# Patient Record
Sex: Male | Born: 1940 | Race: White | Hispanic: No | Marital: Married | Smoking: Former smoker
Health system: Southern US, Community
[De-identification: ages and names within clinical notes are randomized; demographics above are authoritative.]

## PROBLEM LIST (undated history)

## (undated) DIAGNOSIS — N400 Enlarged prostate without lower urinary tract symptoms: Secondary | ICD-10-CM

## (undated) DIAGNOSIS — Z87442 Personal history of urinary calculi: Secondary | ICD-10-CM

## (undated) HISTORY — PX: CHOLECYSTECTOMY: SHX55

## (undated) HISTORY — PX: ORIF WRIST FRACTURE: SHX2133

---

## 2010-06-22 ENCOUNTER — Ambulatory Visit (HOSPITAL_BASED_OUTPATIENT_CLINIC_OR_DEPARTMENT_OTHER): Admission: RE | Admit: 2010-06-22 | Discharge: 2010-06-22 | Payer: Self-pay | Admitting: Orthopedic Surgery

## 2010-06-26 ENCOUNTER — Ambulatory Visit (HOSPITAL_COMMUNITY): Admission: RE | Admit: 2010-06-26 | Discharge: 2010-06-26 | Payer: Self-pay | Admitting: Orthopedic Surgery

## 2010-07-07 ENCOUNTER — Ambulatory Visit (HOSPITAL_BASED_OUTPATIENT_CLINIC_OR_DEPARTMENT_OTHER): Admission: RE | Admit: 2010-07-07 | Discharge: 2010-07-07 | Payer: Self-pay | Admitting: Orthopedic Surgery

## 2010-10-31 LAB — ANAEROBIC CULTURE

## 2010-10-31 LAB — TISSUE CULTURE

## 2010-10-31 LAB — WOUND CULTURE: Culture: NO GROWTH

## 2010-10-31 LAB — APTT: aPTT: 37 seconds (ref 24–37)

## 2010-10-31 LAB — PROTIME-INR
INR: 1.12 (ref 0.00–1.49)
Prothrombin Time: 14.6 s (ref 11.6–15.2)

## 2010-10-31 LAB — POCT HEMOGLOBIN-HEMACUE: Hemoglobin: 15.8 g/dL (ref 13.0–17.0)

## 2011-04-03 IMAGING — XA IR FLUORO GUIDE CV LINE*R*
1 series · 2 of 2 positions shown · non-contrast
Comparison: none

CLINICAL DATA: Osteomyelitis in the right wrist and hand.  Previous
attempts to place a PICC line in the left arm were unsuccessful at
an outside institution.

[Series 1: run · 2 of 2 slices shown]
[im 1/2]
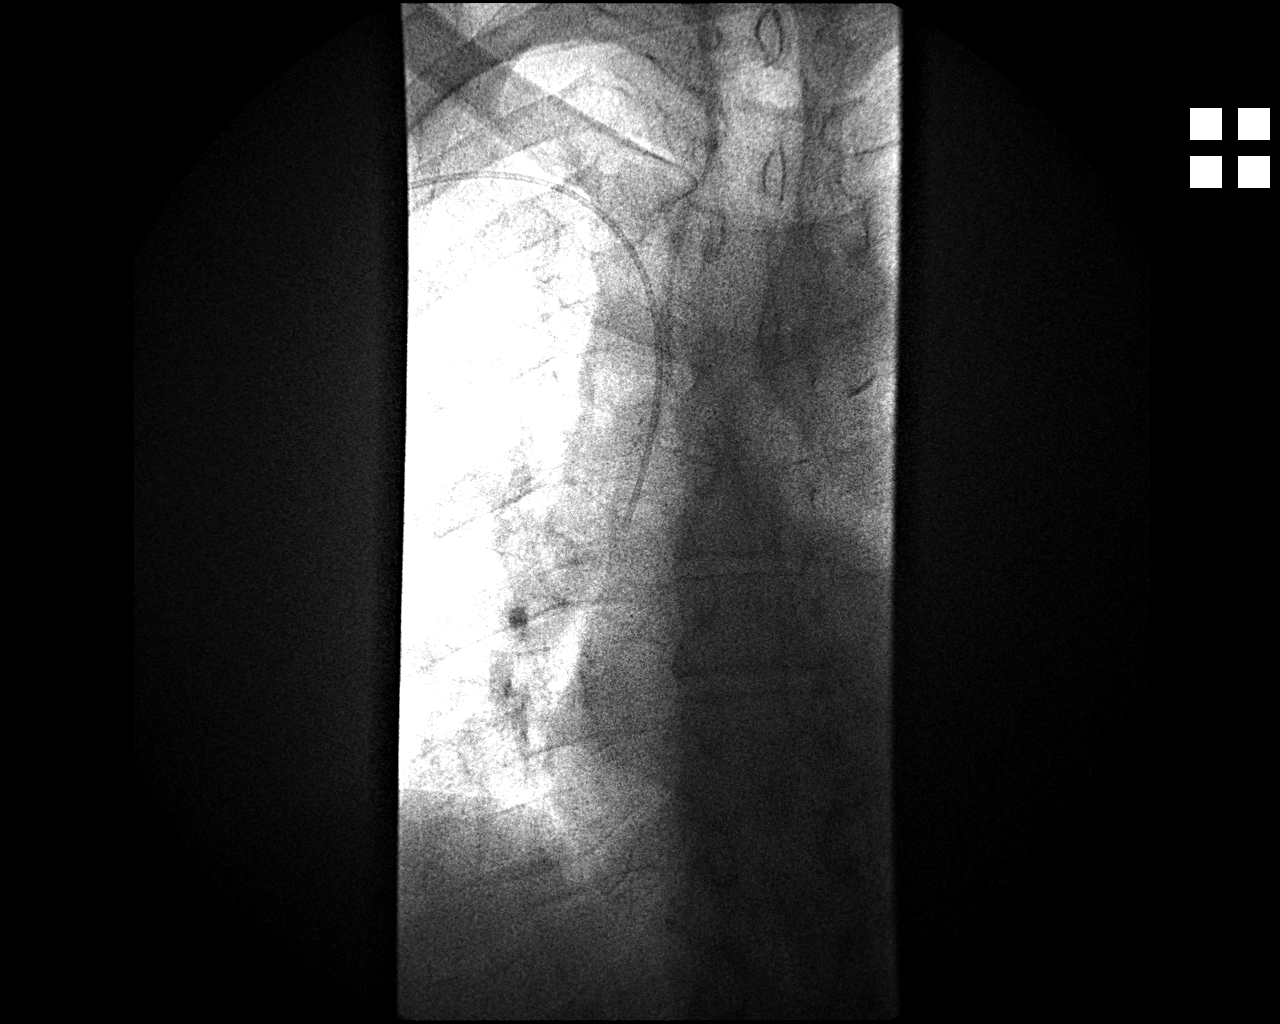
[im 2/2]
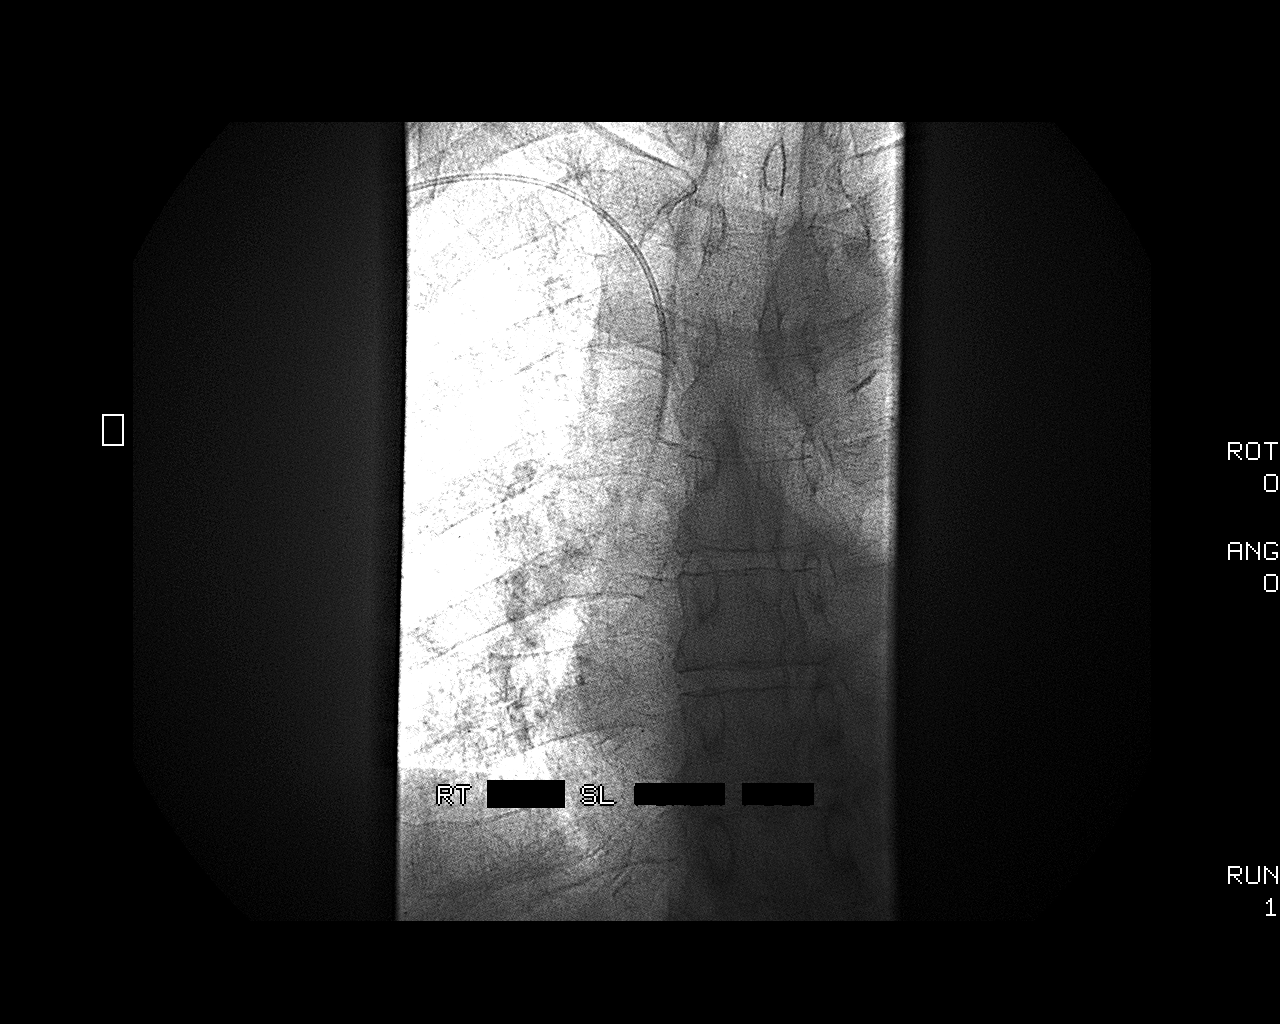

[2 of 2 positions shown; findings below may reference images not displayed]

PICC LINE PLACEMENT WITH ULTRASOUND AND FLUOROSCOPIC  GUIDANCE

Fluoroscopy Time: 0.7 minutes.

The right arm was prepped with chlorhexidine, draped in the usual
sterile fashion using maximum barrier technique (cap and mask,
sterile gown, sterile gloves, large sterile sheet, hand hygiene and
cutaneous antisepsis) and infiltrated locally with 1% Lidocaine.

Ultrasound demonstrated patency of the right basilic vein, and this
was documented with an image.  Under real-time ultrasound guidance,
this vein was accessed with a 21 gauge micropuncture needle and
image documentation was performed.  The needle was exchanged over a
guidewire for a peel-away sheath through which a 5 French single
lumen PICC trimmed to 35 cm was advanced, positioned with its tip
in the lower SVC.  Fluoroscopy during the procedure and fluoro spot
radiograph confirms appropriate catheter position.  The catheter
was flushed, secured to the skin with Prolene sutures, and covered
with a sterile dressing.

Complications:  None
IMPRESSION: Successful right arm PICC line placement with ultrasound and
fluoroscopic guidance.  The catheter is ready for use.

## 2015-03-16 DIAGNOSIS — E785 Hyperlipidemia, unspecified: Secondary | ICD-10-CM | POA: Diagnosis not present

## 2015-03-16 DIAGNOSIS — Z0000 Encounter for general adult medical examination without abnormal findings: Secondary | ICD-10-CM | POA: Diagnosis not present

## 2015-03-23 DIAGNOSIS — Z1389 Encounter for screening for other disorder: Secondary | ICD-10-CM | POA: Diagnosis not present

## 2015-03-23 DIAGNOSIS — Z0000 Encounter for general adult medical examination without abnormal findings: Secondary | ICD-10-CM | POA: Diagnosis not present

## 2015-03-23 DIAGNOSIS — N400 Enlarged prostate without lower urinary tract symptoms: Secondary | ICD-10-CM | POA: Diagnosis not present

## 2015-03-23 DIAGNOSIS — L570 Actinic keratosis: Secondary | ICD-10-CM | POA: Diagnosis not present

## 2015-10-12 DIAGNOSIS — M25521 Pain in right elbow: Secondary | ICD-10-CM | POA: Diagnosis not present

## 2015-10-12 DIAGNOSIS — M7711 Lateral epicondylitis, right elbow: Secondary | ICD-10-CM | POA: Diagnosis not present

## 2016-04-10 DIAGNOSIS — Z0000 Encounter for general adult medical examination without abnormal findings: Secondary | ICD-10-CM | POA: Diagnosis not present

## 2016-04-17 DIAGNOSIS — N401 Enlarged prostate with lower urinary tract symptoms: Secondary | ICD-10-CM | POA: Diagnosis not present

## 2016-04-17 DIAGNOSIS — Z0000 Encounter for general adult medical examination without abnormal findings: Secondary | ICD-10-CM | POA: Diagnosis not present

## 2016-04-17 DIAGNOSIS — I10 Essential (primary) hypertension: Secondary | ICD-10-CM | POA: Diagnosis not present

## 2016-04-17 DIAGNOSIS — Z6822 Body mass index (BMI) 22.0-22.9, adult: Secondary | ICD-10-CM | POA: Diagnosis not present

## 2016-09-11 DIAGNOSIS — H2513 Age-related nuclear cataract, bilateral: Secondary | ICD-10-CM | POA: Diagnosis not present

## 2016-09-25 DIAGNOSIS — H2513 Age-related nuclear cataract, bilateral: Secondary | ICD-10-CM | POA: Diagnosis not present

## 2016-09-26 NOTE — Patient Instructions (Signed)
Your procedure is scheduled on:  10/02/2016  Report to [facility] at  930   AM.  Call this number if you have problems the morning of surgery: [phone]   Do not eat food or drink liquids :After Midnight.      Take these medicines the morning of surgery with A SIP OF WATER: cardura   Do not wear jewelry, make-up or nail polish.  Do not wear lotions, powders, or perfumes. You may wear deodorant.  Do not shave 48 hours prior to surgery.  Do not bring valuables to the hospital.  Contacts, dentures or bridgework may not be worn into surgery.  Leave suitcase in the car. After surgery it may be brought to your room.  For patients admitted to the hospital, checkout time is 11:00 AM the day of discharge.   Patients discharged the day of surgery will not be allowed to drive home.  :     Please read over the following fact sheets that you were given: Coughing and Deep Breathing, Surgical Site Infection Prevention, Anesthesia Post-op Instructions and Care and Recovery After Surgery    Cataract A cataract is a clouding of the lens of the eye. When a lens becomes cloudy, vision is reduced based on the degree and nature of the clouding. Many cataracts reduce vision to some degree. Some cataracts make people more near-sighted as they develop. Other cataracts increase glare. Cataracts that are ignored and become worse can sometimes look white. The white color can be seen through the pupil. CAUSES   Aging. However, cataracts may occur at any age, even in newborns.   Certain drugs.   Trauma to the eye.   Certain diseases such as diabetes.   Specific eye diseases such as chronic inflammation inside the eye or a sudden attack of a rare form of glaucoma.   Inherited or acquired medical problems.  SYMPTOMS   Gradual, progressive drop in vision in the affected eye.   Severe, rapid visual loss. This most often happens when trauma is the cause.  DIAGNOSIS  To detect a cataract, an eye doctor  examines the lens. Cataracts are best diagnosed with an exam of the eyes with the pupils enlarged (dilated) by drops.  TREATMENT  For an early cataract, vision may improve by using different eyeglasses or stronger lighting. If that does not help your vision, surgery is the only effective treatment. A cataract needs to be surgically removed when vision loss interferes with your everyday activities, such as driving, reading, or watching TV. A cataract may also have to be removed if it prevents examination or treatment of another eye problem. Surgery removes the cloudy lens and usually replaces it with a substitute lens (intraocular lens, IOL).  At a time when both you and your doctor agree, the cataract will be surgically removed. If you have cataracts in both eyes, only one is usually removed at a time. This allows the operated eye to heal and be out of danger from any possible problems after surgery (such as infection or poor wound healing). In rare cases, a cataract may be doing damage to your eye. In these cases, your caregiver may advise surgical removal right away. The vast majority of people who have cataract surgery have better vision afterward. HOME CARE INSTRUCTIONS  If you are not planning surgery, you may be asked to do the following:  Use different eyeglasses.   Use stronger or brighter lighting.   Ask your eye doctor about reducing your medicine  dose or changing medicines if it is thought that a medicine caused your cataract. Changing medicines does not make the cataract go away on its own.   Become familiar with your surroundings. Poor vision can lead to injury. Avoid bumping into things on the affected side. You are at a higher risk for tripping or falling.   Exercise extreme care when driving or operating machinery.   Wear sunglasses if you are sensitive to bright light or experiencing problems with glare.  SEEK IMMEDIATE MEDICAL CARE IF:   You have a worsening or sudden vision  loss.   You notice redness, swelling, or increasing pain in the eye.   You have a fever.  Document Released: 08/06/2005 Document Revised: 07/26/2011 Document Reviewed: 03/30/2011 [facility] Patient Information 2012 [place].PATIENT INSTRUCTIONS POST-ANESTHESIA  IMMEDIATELY FOLLOWING SURGERY:  Do not drive or operate machinery for the first twenty four hours after surgery.  Do not make any important decisions for twenty four hours after surgery or while taking narcotic pain medications or sedatives.  If you develop intractable nausea and vomiting or a severe headache please notify your doctor immediately.  FOLLOW-UP:  Please make an appointment with your surgeon as instructed. You do not need to follow up with anesthesia unless specifically instructed to do so.  WOUND CARE INSTRUCTIONS (if applicable):  Keep a dry clean dressing on the anesthesia/puncture wound site if there is drainage.  Once the wound has quit draining you may leave it open to air.  Generally you should leave the bandage intact for twenty four hours unless there is drainage.  If the epidural site drains for more than 36-48 hours please call the anesthesia department.  QUESTIONS?:  Please feel free to call your physician or the hospital operator if you have any questions, and they will be happy to assist you.

## 2016-09-28 ENCOUNTER — Encounter (HOSPITAL_COMMUNITY)
Admission: RE | Admit: 2016-09-28 | Discharge: 2016-09-28 | Disposition: A | Discharge status: Home / Self Care | Payer: Medicare PPO | Source: Ambulatory Visit | Attending: Ophthalmology | Admitting: Ophthalmology

## 2016-09-28 ENCOUNTER — Other Ambulatory Visit: Payer: Self-pay

## 2016-09-28 ENCOUNTER — Encounter (HOSPITAL_COMMUNITY): Payer: Self-pay

## 2016-09-28 DIAGNOSIS — Z01810 Encounter for preprocedural cardiovascular examination: Secondary | ICD-10-CM | POA: Diagnosis not present

## 2016-09-28 DIAGNOSIS — Z01812 Encounter for preprocedural laboratory examination: Secondary | ICD-10-CM | POA: Insufficient documentation

## 2016-09-28 DIAGNOSIS — H268 Other specified cataract: Secondary | ICD-10-CM | POA: Diagnosis not present

## 2016-09-28 HISTORY — DX: Personal history of urinary calculi: Z87.442

## 2016-09-28 HISTORY — DX: Benign prostatic hyperplasia without lower urinary tract symptoms: N40.0

## 2016-09-28 LAB — CBC WITH DIFFERENTIAL/PLATELET
BASOS ABS: 0 10*3/uL (ref 0.0–0.1)
Basophils Relative: 1 %
EOS ABS: 0.1 10*3/uL (ref 0.0–0.7)
EOS PCT: 3 %
HCT: 40.2 % (ref 39.0–52.0)
Hemoglobin: 14.1 g/dL (ref 13.0–17.0)
LYMPHS ABS: 1.8 10*3/uL (ref 0.7–4.0)
LYMPHS PCT: 32 %
MCH: 30.9 pg (ref 26.0–34.0)
MCHC: 35.1 g/dL (ref 30.0–36.0)
MCV: 88 fL (ref 78.0–100.0)
MONO ABS: 0.3 10*3/uL (ref 0.1–1.0)
Monocytes Relative: 5 %
Neutro Abs: 3.3 10*3/uL (ref 1.7–7.7)
Neutrophils Relative %: 59 %
PLATELETS: 164 10*3/uL (ref 150–400)
RBC: 4.57 MIL/uL (ref 4.22–5.81)
RDW: 12.5 % (ref 11.5–15.5)
WBC: 5.5 10*3/uL (ref 4.0–10.5)

## 2016-09-28 LAB — BASIC METABOLIC PANEL
Anion gap: 3 — ABNORMAL LOW (ref 5–15)
BUN: 21 mg/dL — AB (ref 6–20)
CALCIUM: 8.9 mg/dL (ref 8.9–10.3)
CO2: 29 mmol/L (ref 22–32)
Chloride: 101 mmol/L (ref 101–111)
Creatinine, Ser: 1 mg/dL (ref 0.61–1.24)
GFR calc Af Amer: >60 mL/min (ref >60)
GLUCOSE: 107 mg/dL — AB (ref 65–99)
Potassium: 4 mmol/L (ref 3.5–5.1)
SODIUM: 133 mmol/L — AB (ref 135–145)

## 2016-10-02 ENCOUNTER — Ambulatory Visit (HOSPITAL_COMMUNITY): Payer: Medicare PPO | Admitting: Anesthesiology

## 2016-10-02 ENCOUNTER — Ambulatory Visit (HOSPITAL_COMMUNITY)
Admission: RE | Admit: 2016-10-02 | Discharge: 2016-10-02 | Disposition: A | Discharge status: Home / Self Care | Payer: Medicare PPO | Source: Ambulatory Visit | Attending: Ophthalmology | Admitting: Ophthalmology

## 2016-10-02 ENCOUNTER — Encounter (HOSPITAL_COMMUNITY): Payer: Self-pay | Admitting: Anesthesiology

## 2016-10-02 ENCOUNTER — Encounter (HOSPITAL_COMMUNITY)
Admission: RE | Disposition: A | Discharge status: Home / Self Care | Payer: Self-pay | Source: Ambulatory Visit | Attending: Ophthalmology

## 2016-10-02 DIAGNOSIS — Z87891 Personal history of nicotine dependence: Secondary | ICD-10-CM | POA: Diagnosis not present

## 2016-10-02 DIAGNOSIS — H2511 Age-related nuclear cataract, right eye: Secondary | ICD-10-CM | POA: Diagnosis not present

## 2016-10-02 DIAGNOSIS — H2512 Age-related nuclear cataract, left eye: Secondary | ICD-10-CM | POA: Diagnosis not present

## 2016-10-02 HISTORY — PX: CATARACT EXTRACTION W/PHACO: SHX586

## 2016-10-02 SURGERY — PHACOEMULSIFICATION, CATARACT, WITH IOL INSERTION
Anesthesia: Monitor Anesthesia Care | Site: Eye | Laterality: Right

## 2016-10-02 MED ORDER — LACTATED RINGERS IV SOLN
INTRAVENOUS | Status: DC
  Administered 2016-10-02: 1000 mL via INTRAVENOUS

## 2016-10-02 MED ORDER — FENTANYL CITRATE (PF) 100 MCG/2ML IJ SOLN
INTRAMUSCULAR | Status: AC
  Filled 2016-10-02: qty 2

## 2016-10-02 MED ORDER — EPINEPHRINE PF 1 MG/ML IJ SOLN
INTRAMUSCULAR | Status: AC
  Filled 2016-10-02: qty 1

## 2016-10-02 MED ORDER — MIDAZOLAM HCL 2 MG/2ML IJ SOLN
1.0000 mg | INTRAMUSCULAR | Status: AC
  Administered 2016-10-02: 2 mg via INTRAVENOUS

## 2016-10-02 MED ORDER — BSS IO SOLN
INTRAOCULAR | Status: DC | PRN
  Administered 2016-10-02: 15 mL

## 2016-10-02 MED ORDER — PROVISC 10 MG/ML IO SOLN
INTRAOCULAR | Status: DC | PRN
  Administered 2016-10-02: 0.85 mL via INTRAOCULAR

## 2016-10-02 MED ORDER — PHENYLEPHRINE HCL 2.5 % OP SOLN
1.0000 [drp] | OPHTHALMIC | Status: AC
  Administered 2016-10-02 (×3): 1 [drp] via OPHTHALMIC

## 2016-10-02 MED ORDER — TETRACAINE 0.5 % OP SOLN OPTIME - NO CHARGE
OPHTHALMIC | Status: DC | PRN
  Administered 2016-10-02: 2 [drp] via OPHTHALMIC

## 2016-10-02 MED ORDER — MIDAZOLAM HCL 2 MG/2ML IJ SOLN
INTRAMUSCULAR | Status: AC
  Filled 2016-10-02: qty 2

## 2016-10-02 MED ORDER — CYCLOPENTOLATE-PHENYLEPHRINE 0.2-1 % OP SOLN
1.0000 [drp] | OPHTHALMIC | Status: AC
  Administered 2016-10-02 (×3): 1 [drp] via OPHTHALMIC

## 2016-10-02 MED ORDER — KETOROLAC TROMETHAMINE 0.5 % OP SOLN
1.0000 [drp] | OPHTHALMIC | Status: AC
  Administered 2016-10-02 (×3): 1 [drp] via OPHTHALMIC

## 2016-10-02 MED ORDER — FENTANYL CITRATE (PF) 100 MCG/2ML IJ SOLN
25.0000 ug | INTRAMUSCULAR | Status: AC
  Administered 2016-10-02 (×2): 25 ug via INTRAVENOUS

## 2016-10-02 MED ORDER — EPINEPHRINE PF 1 MG/ML IJ SOLN
INTRAOCULAR | Status: DC | PRN
  Administered 2016-10-02: 500 mL

## 2016-10-02 MED ORDER — TETRACAINE HCL 0.5 % OP SOLN
1.0000 [drp] | OPHTHALMIC | Status: AC
  Administered 2016-10-02 (×3): 1 [drp] via OPHTHALMIC

## 2016-10-02 SURGICAL SUPPLY — 11 items
CLOTH BEACON ORANGE TIMEOUT ST (SAFETY) ×2 IMPLANT
EYE SHIELD UNIVERSAL CLEAR (GAUZE/BANDAGES/DRESSINGS) ×2 IMPLANT
GLOVE BIO SURGEON STRL SZ 6.5 (GLOVE) ×1 IMPLANT
GLOVE BIO SURGEONS STRL SZ 6.5 (GLOVE) ×1
GLOVE BIOGEL PI IND STRL 7.0 (GLOVE) IMPLANT
GLOVE BIOGEL PI INDICATOR 7.0 (GLOVE) ×2
LENS ALC ACRYL/TECN (Ophthalmic Related) ×3 IMPLANT
PAD ARMBOARD 7.5X6 YLW CONV (MISCELLANEOUS) ×2 IMPLANT
TAPE SURG TRANSPORE 1 IN (GAUZE/BANDAGES/DRESSINGS) IMPLANT
TAPE SURGICAL TRANSPORE 1 IN (GAUZE/BANDAGES/DRESSINGS) ×2
WATER STERILE IRR 250ML POUR (IV SOLUTION) ×2 IMPLANT

## 2016-10-02 NOTE — Anesthesia Postprocedure Evaluation (Signed)
Anesthesia Post Note  Patient: SIMON LLAMAS  Procedure(s) Performed: Procedure(s) (LRB): CATARACT EXTRACTION PHACO AND INTRAOCULAR LENS PLACEMENT (IOC) (Right)  Patient location during evaluation: Short Stay Anesthesia Type: MAC Level of consciousness: awake and alert and oriented Pain management: pain level controlled Vital Signs Assessment: post-procedure vital signs reviewed and stable Respiratory status: spontaneous breathing Cardiovascular status: blood pressure returned to baseline Postop Assessment: no signs of nausea or vomiting Anesthetic complications: no     Last Vitals:  Vitals:   10/02/16 0900 10/02/16 0905  BP: 122/68   Resp: (!) 34 (!) 37  Temp:      Last Pain: There were no vitals filed for this visit.               Sylina Henion

## 2016-10-02 NOTE — H&P (Signed)
The patient was re examined and there is no change in the patients condition since the original H and P. 

## 2016-10-02 NOTE — Anesthesia Preprocedure Evaluation (Signed)
Anesthesia Evaluation  Patient identified by MRN, date of birth, ID band Patient awake    Reviewed: Allergy & Precautions, NPO status , Patient's Chart, lab work & pertinent test results  History of Anesthesia Complications (+) POST - OP SPINAL HEADACHE  Airway Mallampati: II  TM Distance: >3 FB     Dental  (+) Upper Dentures   Pulmonary neg pulmonary ROS, former smoker,    breath sounds clear to auscultation       Cardiovascular negative cardio ROS   Rhythm:Regular Rate:Normal     Neuro/Psych    GI/Hepatic negative GI ROS,   Endo/Other    Renal/GU Renal diseaseBPH, hx of renal calculi     Musculoskeletal   Abdominal   Peds  Hematology   Anesthesia Other Findings   Reproductive/Obstetrics                             Anesthesia Physical Anesthesia Plan  ASA: II  Anesthesia Plan: MAC   Post-op Pain Management:    Induction: Intravenous  Airway Management Planned: Nasal Cannula  Additional Equipment:   Intra-op Plan:   Post-operative Plan:   Informed Consent: I have reviewed the patients History and Physical, chart, labs and discussed the procedure including the risks, benefits and alternatives for the proposed anesthesia with the patient or authorized representative who has indicated his/her understanding and acceptance.     Plan Discussed with:   Anesthesia Plan Comments:         Anesthesia Quick Evaluation  

## 2016-10-02 NOTE — Discharge Instructions (Signed)
°  °          [facility] Instructions °[address]- [place] [address]-[place] °    ° °1. Avoid closing eyes tightly. One often closes the eye tightly when laughing, talking, sneezing, coughing or if they feel irritated. At these times, you should be careful not to close your eyes tightly. ° °2. Instill eye drops as instructed. To instill drops in your eye, open it, look up and have someone gently pull the lower lid down and instill a couple of drops inside the lower lid. ° °3. Do not touch upper lid. ° °4. Take Advil or Tylenol for pain. ° °5. You may use either eye for near work, such as reading or sewing and you may watch television. ° °6. You may have your hair done at the beauty parlor at any time. ° °7. Wear dark glasses with or without your own glasses if you are in bright light. ° °8. Call our office at [phone] or [phone] if you have sharp pain in your eye or unusual symptoms. ° °9.  FOLLOW UP WITH DR. SHAPIRO TODAY IN HIS [place] OFFICE AT 3:00pm. ° °  °I have received a copy of the above instructions and will follow them.  ° ° ° °IF YOU ARE IN IMMEDIATE DANGER CALL 911! ° °It is important for you to keep your follow-up appointment with your physician after discharge, OR, for you /your caregiver to make a follow-up appointment with your physician / medical provider after discharge. ° °Show these instructions to the next healthcare provider you see. ° ° °PATIENT INSTRUCTIONS °POST-ANESTHESIA ° °IMMEDIATELY FOLLOWING SURGERY:  Do not drive or operate machinery for the first twenty four hours after surgery.  Do not make any important decisions for twenty four hours after surgery or while taking narcotic pain medications or sedatives.  If you develop intractable nausea and vomiting or a severe headache please notify your doctor immediately. ° °FOLLOW-UP:  Please make an appointment with your surgeon as instructed. You do not need to follow up with anesthesia unless  specifically instructed to do so. ° °WOUND CARE INSTRUCTIONS (if applicable):  Keep a dry clean dressing on the anesthesia/puncture wound site if there is drainage.  Once the wound has quit draining you may leave it open to air.  Generally you should leave the bandage intact for twenty four hours unless there is drainage.  If the epidural site drains for more than 36-48 hours please call the anesthesia department. ° °QUESTIONS?:  Please feel free to call your physician or the hospital operator if you have any questions, and they will be happy to assist you.    ° ° ° °

## 2016-10-02 NOTE — Op Note (Signed)
Patient brought to the operating room and prepped and draped in the usual manner.  Lid speculum inserted in right eye.  Stab incision made at the twelve o'clock position.  Provisc instilled in the anterior chamber.   A 2.4 mm. Stab incision was made temporally.  An anterior capsulotomy was done with a bent 25 gauge needle.  The nucleus was hydrodissected.  The Phaco tip was inserted in the anterior chamber and the nucleus was emulsified.  CDE was 6.21.  The cortical material was then removed with the I and A tip.  Posterior capsule was the polished.  The anterior chamber was deepened with Provisc.  A 19.5 Diopter Alcon AU00T0 IOL was then inserted in the capsular bag.  Provisc was then removed with the I and A tip.  The wound was then hydrated.  Patient sent to the Recovery Room in good condition with follow up in my office.  Preoperative Diagnosis:  Nuclear Cataract OD Postoperative Diagnosis:  Same Procedure name: Kelman Phacoemulsification OD with IOL

## 2016-10-02 NOTE — Transfer of Care (Signed)
Immediate Anesthesia Transfer of Care Note  Patient: Charles Massey  Procedure(s) Performed: Procedure(s) with comments: CATARACT EXTRACTION PHACO AND INTRAOCULAR LENS PLACEMENT (IOC) (Right) - CDE: 6.21   Patient Location: Short Stay  Anesthesia Type:MAC  Level of Consciousness: awake  Airway & Oxygen Therapy: Patient Spontanous Breathing  Post-op Assessment: Report given to RN  Post vital signs: Reviewed  Last Vitals:  Vitals:   10/02/16 0900 10/02/16 0905  BP: 122/68   Resp: (!) 34 (!) 37  Temp:      Last Pain: There were no vitals filed for this visit.       Complications: No apparent anesthesia complications

## 2016-10-03 ENCOUNTER — Encounter (HOSPITAL_COMMUNITY): Payer: Self-pay | Admitting: Ophthalmology

## 2016-10-10 ENCOUNTER — Encounter (HOSPITAL_COMMUNITY): Payer: Self-pay

## 2016-10-11 ENCOUNTER — Encounter (HOSPITAL_COMMUNITY)
Admission: RE | Admit: 2016-10-11 | Discharge: 2016-10-11 | Disposition: A | Discharge status: Home / Self Care | Payer: Medicare PPO | Source: Ambulatory Visit | Attending: Ophthalmology | Admitting: Ophthalmology

## 2016-10-16 ENCOUNTER — Encounter (HOSPITAL_COMMUNITY)
Admission: RE | Disposition: A | Discharge status: Home / Self Care | Payer: Self-pay | Source: Ambulatory Visit | Attending: Ophthalmology

## 2016-10-16 ENCOUNTER — Ambulatory Visit (HOSPITAL_COMMUNITY)
Admission: RE | Admit: 2016-10-16 | Discharge: 2016-10-16 | Disposition: A | Discharge status: Home / Self Care | Payer: Medicare PPO | Source: Ambulatory Visit | Attending: Ophthalmology | Admitting: Ophthalmology

## 2016-10-16 ENCOUNTER — Ambulatory Visit (HOSPITAL_COMMUNITY): Payer: Medicare PPO | Admitting: Anesthesiology

## 2016-10-16 ENCOUNTER — Encounter (HOSPITAL_COMMUNITY): Payer: Self-pay | Admitting: *Deleted

## 2016-10-16 DIAGNOSIS — H2512 Age-related nuclear cataract, left eye: Secondary | ICD-10-CM | POA: Insufficient documentation

## 2016-10-16 DIAGNOSIS — Z87891 Personal history of nicotine dependence: Secondary | ICD-10-CM | POA: Insufficient documentation

## 2016-10-16 HISTORY — PX: CATARACT EXTRACTION W/PHACO: SHX586

## 2016-10-16 SURGERY — PHACOEMULSIFICATION, CATARACT, WITH IOL INSERTION
Anesthesia: Monitor Anesthesia Care | Site: Eye | Laterality: Left

## 2016-10-16 MED ORDER — FENTANYL CITRATE (PF) 100 MCG/2ML IJ SOLN
25.0000 ug | INTRAMUSCULAR | Status: AC
  Administered 2016-10-16: 25 ug via INTRAVENOUS

## 2016-10-16 MED ORDER — LACTATED RINGERS IV SOLN
INTRAVENOUS | Status: DC
  Administered 2016-10-16: 08:00:00 via INTRAVENOUS

## 2016-10-16 MED ORDER — FENTANYL CITRATE (PF) 100 MCG/2ML IJ SOLN
INTRAMUSCULAR | Status: AC
  Filled 2016-10-16: qty 2

## 2016-10-16 MED ORDER — CYCLOPENTOLATE-PHENYLEPHRINE 0.2-1 % OP SOLN
1.0000 [drp] | OPHTHALMIC | Status: AC
  Administered 2016-10-16 (×3): 1 [drp] via OPHTHALMIC

## 2016-10-16 MED ORDER — TETRACAINE 0.5 % OP SOLN OPTIME - NO CHARGE
OPHTHALMIC | Status: DC | PRN
  Administered 2016-10-16: 1 [drp] via OPHTHALMIC

## 2016-10-16 MED ORDER — PROVISC 10 MG/ML IO SOLN
INTRAOCULAR | Status: DC | PRN
Start: 2016-10-16 — End: 2016-10-16
  Administered 2016-10-16: 0.85 mL via INTRAOCULAR

## 2016-10-16 MED ORDER — EPINEPHRINE PF 1 MG/ML IJ SOLN
INTRAOCULAR | Status: DC | PRN
  Administered 2016-10-16: 500 mL

## 2016-10-16 MED ORDER — PHENYLEPHRINE HCL 2.5 % OP SOLN
1.0000 [drp] | OPHTHALMIC | Status: AC
  Administered 2016-10-16 (×3): 1 [drp] via OPHTHALMIC

## 2016-10-16 MED ORDER — KETOROLAC TROMETHAMINE 0.5 % OP SOLN
1.0000 [drp] | OPHTHALMIC | Status: AC
  Administered 2016-10-16 (×3): 1 [drp] via OPHTHALMIC

## 2016-10-16 MED ORDER — BSS IO SOLN
INTRAOCULAR | Status: DC | PRN
  Administered 2016-10-16: 15 mL

## 2016-10-16 MED ORDER — MIDAZOLAM HCL 2 MG/2ML IJ SOLN
INTRAMUSCULAR | Status: AC
  Filled 2016-10-16: qty 2

## 2016-10-16 MED ORDER — MIDAZOLAM HCL 2 MG/2ML IJ SOLN
1.0000 mg | INTRAMUSCULAR | Status: AC
  Administered 2016-10-16: 08:00:00 via INTRAVENOUS
  Administered 2016-10-16: 2 mg via INTRAVENOUS

## 2016-10-16 MED ORDER — TETRACAINE HCL 0.5 % OP SOLN
1.0000 [drp] | OPHTHALMIC | Status: AC
  Administered 2016-10-16 (×3): 1 [drp] via OPHTHALMIC

## 2016-10-16 SURGICAL SUPPLY — 9 items
CLOTH BEACON ORANGE TIMEOUT ST (SAFETY) ×2 IMPLANT
EYE SHIELD UNIVERSAL CLEAR (GAUZE/BANDAGES/DRESSINGS) ×2 IMPLANT
GLOVE ECLIPSE 6.5 STRL STRAW (GLOVE) ×2 IMPLANT
GLOVE EXAM NITRILE MD LF STRL (GLOVE) ×2 IMPLANT
LENS ALC ACRYL/TECN (Ophthalmic Related) ×3 IMPLANT
PAD ARMBOARD 7.5X6 YLW CONV (MISCELLANEOUS) ×2 IMPLANT
TAPE SURG TRANSPORE 1 IN (GAUZE/BANDAGES/DRESSINGS) IMPLANT
TAPE SURGICAL TRANSPORE 1 IN (GAUZE/BANDAGES/DRESSINGS) ×2
WATER STERILE IRR 250ML POUR (IV SOLUTION) ×2 IMPLANT

## 2016-10-16 NOTE — Anesthesia Postprocedure Evaluation (Signed)
Anesthesia Post Note  Patient: Charles Massey  Procedure(s) Performed: Procedure(s) (LRB): CATARACT EXTRACTION PHACO AND INTRAOCULAR LENS PLACEMENT (IOC) (Left)  Patient location during evaluation: Short Stay Anesthesia Type: MAC Level of consciousness: awake and alert and oriented Pain management: pain level controlled Vital Signs Assessment: post-procedure vital signs reviewed and stable Respiratory status: spontaneous breathing Cardiovascular status: blood pressure returned to baseline Postop Assessment: no signs of nausea or vomiting Anesthetic complications: no     Last Vitals:  Vitals:   10/16/16 0800  BP: 116/75  Pulse: (!) 59  Resp: 10  Temp: 36.4 C    Last Pain:  Vitals:   10/16/16 0800  TempSrc: Oral                 Jennie Hannay

## 2016-10-16 NOTE — Transfer of Care (Signed)
Immediate Anesthesia Transfer of Care Note  Patient: Charles Massey  Procedure(s) Performed: Procedure(s) with comments: CATARACT EXTRACTION PHACO AND INTRAOCULAR LENS PLACEMENT (IOC) (Left) - CDE: 3.94  Patient Location: Short Stay  Anesthesia Type:MAC  Level of Consciousness: awake  Airway & Oxygen Therapy: Patient Spontanous Breathing  Post-op Assessment: Report given to RN  Post vital signs: Reviewed  Last Vitals:  Vitals:   10/16/16 0800  BP: 116/75  Pulse: (!) 59  Resp: 10  Temp: 36.4 C    Last Pain:  Vitals:   10/16/16 0800  TempSrc: Oral         Complications: No apparent anesthesia complications

## 2016-10-16 NOTE — Op Note (Signed)
Patient brought to the operating room and prepped and draped in the usual manner.  Lid speculum inserted in left eye.  Stab incision made at the twelve o'clock position.  Provisc instilled in the anterior chamber.   A 2.4 mm. Stab incision was made temporally.  An anterior capsulotomy was done with a bent 25 gauge needle.  The nucleus was hydrodissected.  The Phaco tip was inserted in the anterior chamber and the nucleus was emulsified.  CDE was 3.94.  The cortical material was then removed with the I and A tip.  Posterior capsule was the polished.  The anterior chamber was deepened with Provisc.  A 19.5 Diopter Alcon AU00T0 IOL was then inserted in the capsular bag.  Provisc was then removed with the I and A tip.  The wound was then hydrated.  Patient sent to the Recovery Room in good condition with follow up in my office.  Preoperative Diagnosis:  Nuclear Cataract OS Postoperative Diagnosis:  Same Procedure name: Kelman Phacoemulsification OS with IOL

## 2016-10-16 NOTE — Anesthesia Preprocedure Evaluation (Signed)
Anesthesia Evaluation  Patient identified by MRN, date of birth, ID band Patient awake    Reviewed: Allergy & Precautions, NPO status , Patient's Chart, lab work & pertinent test results  History of Anesthesia Complications (+) POST - OP SPINAL HEADACHE  Airway Mallampati: II  TM Distance: >3 FB     Dental  (+) Upper Dentures   Pulmonary neg pulmonary ROS, former smoker,    breath sounds clear to auscultation       Cardiovascular negative cardio ROS   Rhythm:Regular Rate:Normal     Neuro/Psych    GI/Hepatic negative GI ROS,   Endo/Other    Renal/GU Renal diseaseBPH, hx of renal calculi     Musculoskeletal   Abdominal   Peds  Hematology   Anesthesia Other Findings   Reproductive/Obstetrics                             Anesthesia Physical Anesthesia Plan  ASA: II  Anesthesia Plan: MAC   Post-op Pain Management:    Induction: Intravenous  Airway Management Planned: Nasal Cannula  Additional Equipment:   Intra-op Plan:   Post-operative Plan:   Informed Consent: I have reviewed the patients History and Physical, chart, labs and discussed the procedure including the risks, benefits and alternatives for the proposed anesthesia with the patient or authorized representative who has indicated his/her understanding and acceptance.     Plan Discussed with:   Anesthesia Plan Comments:         Anesthesia Quick Evaluation

## 2016-10-16 NOTE — Discharge Instructions (Signed)
PATIENT INSTRUCTIONS °POST-ANESTHESIA ° °IMMEDIATELY FOLLOWING SURGERY:  Do not drive or operate machinery for the first twenty four hours after surgery.  Do not make any important decisions for twenty four hours after surgery or while taking narcotic pain medications or sedatives.  If you develop intractable nausea and vomiting or a severe headache please notify your doctor immediately. ° °FOLLOW-UP:  Please make an appointment with your surgeon as instructed. You do not need to follow up with anesthesia unless specifically instructed to do so. ° °WOUND CARE INSTRUCTIONS (if applicable):  Keep a dry clean dressing on the anesthesia/puncture wound site if there is drainage.  Once the wound has quit draining you may leave it open to air.  Generally you should leave the bandage intact for twenty four hours unless there is drainage.  If the epidural site drains for more than 36-48 hours please call the anesthesia department. ° °QUESTIONS?:  Please feel free to call your physician or the hospital operator if you have any questions, and they will be happy to assist you.    ° ° °  °  °        [facility] Instructions °[address]- [place] [address]-[place] °    ° °1. Avoid closing eyes tightly. One often closes the eye tightly when laughing, talking, sneezing, coughing or if they feel irritated. At these times, you should be careful not to close your eyes tightly. ° °2. Instill eye drops as instructed. To instill drops in your eye, open it, look up and have someone gently pull the lower lid down and instill a couple of drops inside the lower lid. ° °3. Do not touch upper lid. ° °4. Take Advil or Tylenol for pain. ° °5. You may use either eye for near work, such as reading or sewing and you may watch television. ° °6. You may have your hair done at the beauty parlor at any time. ° °7. Wear dark glasses with or without your own glasses if you are in bright light. ° °8. Call our office at  [phone] or [phone] if you have sharp pain in your eye or unusual symptoms. ° °9.  FOLLOW UP WITH DR. SHAPIRO TODAY IN HIS [place] OFFICE AT 3:00 pm. ° °  °I have received a copy of the above instructions and will follow them.  ° ° ° °IF YOU ARE IN IMMEDIATE DANGER CALL 911! ° °It is important for you to keep your follow-up appointment with your physician after discharge, OR, for you /your caregiver to make a follow-up appointment with your physician / medical provider after discharge. ° °Show these instructions to the next healthcare provider you see. ° °

## 2016-10-16 NOTE — H&P (Signed)
The patient was re examined and there is no change in the patients condition since the original H and P. 

## 2016-10-17 ENCOUNTER — Encounter (HOSPITAL_COMMUNITY): Payer: Self-pay | Admitting: Ophthalmology

## 2016-11-13 DIAGNOSIS — Z961 Presence of intraocular lens: Secondary | ICD-10-CM | POA: Diagnosis not present

## 2017-02-19 DIAGNOSIS — Z961 Presence of intraocular lens: Secondary | ICD-10-CM | POA: Diagnosis not present

## 2017-04-15 DIAGNOSIS — L570 Actinic keratosis: Secondary | ICD-10-CM | POA: Diagnosis not present

## 2017-04-15 DIAGNOSIS — I10 Essential (primary) hypertension: Secondary | ICD-10-CM | POA: Diagnosis not present

## 2017-04-15 DIAGNOSIS — N400 Enlarged prostate without lower urinary tract symptoms: Secondary | ICD-10-CM | POA: Diagnosis not present

## 2017-04-19 DIAGNOSIS — Z0000 Encounter for general adult medical examination without abnormal findings: Secondary | ICD-10-CM | POA: Diagnosis not present

## 2017-04-19 DIAGNOSIS — Z6822 Body mass index (BMI) 22.0-22.9, adult: Secondary | ICD-10-CM | POA: Diagnosis not present

## 2017-04-19 DIAGNOSIS — N400 Enlarged prostate without lower urinary tract symptoms: Secondary | ICD-10-CM | POA: Diagnosis not present

## 2017-06-19 DIAGNOSIS — R05 Cough: Secondary | ICD-10-CM | POA: Diagnosis not present

## 2017-06-19 DIAGNOSIS — Z6822 Body mass index (BMI) 22.0-22.9, adult: Secondary | ICD-10-CM | POA: Diagnosis not present

## 2017-06-25 DIAGNOSIS — Z6823 Body mass index (BMI) 23.0-23.9, adult: Secondary | ICD-10-CM | POA: Diagnosis not present

## 2017-06-25 DIAGNOSIS — J069 Acute upper respiratory infection, unspecified: Secondary | ICD-10-CM | POA: Diagnosis not present

## 2017-06-25 DIAGNOSIS — J209 Acute bronchitis, unspecified: Secondary | ICD-10-CM | POA: Diagnosis not present

## 2017-07-04 DIAGNOSIS — Z23 Encounter for immunization: Secondary | ICD-10-CM | POA: Diagnosis not present

## 2017-12-28 DIAGNOSIS — Z6822 Body mass index (BMI) 22.0-22.9, adult: Secondary | ICD-10-CM | POA: Diagnosis not present

## 2017-12-28 DIAGNOSIS — H6122 Impacted cerumen, left ear: Secondary | ICD-10-CM | POA: Diagnosis not present

## 2017-12-28 DIAGNOSIS — H9192 Unspecified hearing loss, left ear: Secondary | ICD-10-CM | POA: Diagnosis not present

## 2018-05-08 DIAGNOSIS — Z0000 Encounter for general adult medical examination without abnormal findings: Secondary | ICD-10-CM | POA: Diagnosis not present

## 2018-05-13 DIAGNOSIS — Z6821 Body mass index (BMI) 21.0-21.9, adult: Secondary | ICD-10-CM | POA: Diagnosis not present

## 2018-05-13 DIAGNOSIS — I10 Essential (primary) hypertension: Secondary | ICD-10-CM | POA: Diagnosis not present

## 2018-05-13 DIAGNOSIS — N401 Enlarged prostate with lower urinary tract symptoms: Secondary | ICD-10-CM | POA: Diagnosis not present

## 2018-05-28 ENCOUNTER — Encounter (INDEPENDENT_AMBULATORY_CARE_PROVIDER_SITE_OTHER): Payer: Self-pay | Admitting: Orthopaedic Surgery

## 2018-05-28 ENCOUNTER — Ambulatory Visit (INDEPENDENT_AMBULATORY_CARE_PROVIDER_SITE_OTHER): Payer: Medicare PPO | Admitting: Orthopaedic Surgery

## 2018-05-28 VITALS — BP 121/66 | HR 58 | Ht 72.0 in | Wt 165.0 lb

## 2018-05-28 DIAGNOSIS — M79604 Pain in right leg: Secondary | ICD-10-CM

## 2018-05-28 NOTE — Progress Notes (Signed)
Office Visit Note   Patient: Charles Massey           Date of Birth: 23-Nov-1940           MRN: [id number] Visit Date: 05/28/2018              Requested by: Selinda Flavin, MD [address] [place], [place] [address] PCP: Selinda Flavin, MD   Assessment & Plan: Visit Diagnoses:  1. Pain in right leg     Plan: presently asymptomatic-no Rx necessary  Follow-Up Instructions: Return if symptoms worsen or fail to improve.   Orders:  No orders of the defined types were placed in this encounter.  No orders of the defined types were placed in this encounter.     Procedures: No procedures performed   Clinical Data: No additional findings.   Subjective: No chief complaint on file. Charles Massey injured his right leg 3 weeks ago pressing down on a mole trap.Experienced burning from knee to right ankle without skin changes. Occ numbness. Most of symptoms have resolved  HPI  Review of Systems   Objective: Vital Signs: There were no vitals taken for this visit.  Physical Exam  Constitutional: He is oriented to person, place, and time. He appears well-developed and well-nourished.  HENT:  Mouth/Throat: Oropharynx is clear and moist.  Eyes: Pupils are equal, round, and reactive to light. EOM are normal.  Pulmonary/Chest: Effort normal.  Neurological: He is alert and oriented to person, place, and time.  Skin: Skin is warm and dry.  Psychiatric: He has a normal mood and affect. His behavior is normal.    Ortho Examright leg without pain, swelling or skin change. No knee pain.n/v intact. FROM ankle and knee. Achilles intact. No ankle pain  Specialty Comments:  No specialty comments available.  Imaging: No results found.   PMFS History: There are no active problems to display for this patient.  Past Medical History:  Diagnosis Date  . BPH (benign prostatic hyperplasia)   . History of kidney stones     History reviewed. No pertinent family history.  Past Surgical History:    Procedure Laterality Date  . CATARACT EXTRACTION W/PHACO Right 10/02/2016   Procedure: CATARACT EXTRACTION PHACO AND INTRAOCULAR LENS PLACEMENT (IOC);  Surgeon: Jethro Bolus, MD;  Location: [facility];  Service: Ophthalmology;  Laterality: Right;  CDE: 6.21   . CATARACT EXTRACTION W/PHACO Left 10/16/2016   Procedure: CATARACT EXTRACTION PHACO AND INTRAOCULAR LENS PLACEMENT (IOC);  Surgeon: Jethro Bolus, MD;  Location: [facility];  Service: Ophthalmology;  Laterality: Left;  CDE: 3.94  . CHOLECYSTECTOMY     with lysis of adhesions  . ORIF WRIST FRACTURE Right    and removal of hardware   Social History   Occupational History  . Not on file  Tobacco Use  . Smoking status: Former Smoker    Packs/day: 1.00    Years: 10.00    Pack years: 10.00    Types: Cigarettes    Last attempt to quit: 09/28/1968    Years since quitting: 49.6  . Smokeless tobacco: Former Engineer, water and Sexual Activity  . Alcohol use: No  . Drug use: No  . Sexual activity: Yes    Birth control/protection: None     Valeria Batman, MD   Note - This record has been created using [employer].  Chart creation errors have been sought, but may not always  have been located. Such creation errors do not reflect on  the  standard of medical care.

## 2018-07-03 DIAGNOSIS — Z6822 Body mass index (BMI) 22.0-22.9, adult: Secondary | ICD-10-CM | POA: Diagnosis not present

## 2018-07-03 DIAGNOSIS — J069 Acute upper respiratory infection, unspecified: Secondary | ICD-10-CM | POA: Diagnosis not present

## 2018-07-25 DIAGNOSIS — L723 Sebaceous cyst: Secondary | ICD-10-CM | POA: Diagnosis not present

## 2018-07-25 DIAGNOSIS — Z6822 Body mass index (BMI) 22.0-22.9, adult: Secondary | ICD-10-CM | POA: Diagnosis not present

## 2019-04-14 DIAGNOSIS — I10 Essential (primary) hypertension: Secondary | ICD-10-CM | POA: Diagnosis not present

## 2019-04-14 DIAGNOSIS — D519 Vitamin B12 deficiency anemia, unspecified: Secondary | ICD-10-CM | POA: Diagnosis not present

## 2019-04-14 DIAGNOSIS — R5382 Chronic fatigue, unspecified: Secondary | ICD-10-CM | POA: Diagnosis not present

## 2019-04-21 DIAGNOSIS — Z6822 Body mass index (BMI) 22.0-22.9, adult: Secondary | ICD-10-CM | POA: Diagnosis not present

## 2019-04-21 DIAGNOSIS — N401 Enlarged prostate with lower urinary tract symptoms: Secondary | ICD-10-CM | POA: Diagnosis not present

## 2019-04-21 DIAGNOSIS — Z0000 Encounter for general adult medical examination without abnormal findings: Secondary | ICD-10-CM | POA: Diagnosis not present

## 2020-04-21 DIAGNOSIS — R5382 Chronic fatigue, unspecified: Secondary | ICD-10-CM | POA: Diagnosis not present

## 2020-04-21 DIAGNOSIS — Z13220 Encounter for screening for lipoid disorders: Secondary | ICD-10-CM | POA: Diagnosis not present

## 2020-04-21 DIAGNOSIS — I10 Essential (primary) hypertension: Secondary | ICD-10-CM | POA: Diagnosis not present

## 2020-04-29 DIAGNOSIS — C4491 Basal cell carcinoma of skin, unspecified: Secondary | ICD-10-CM | POA: Diagnosis not present

## 2020-04-29 DIAGNOSIS — Z6822 Body mass index (BMI) 22.0-22.9, adult: Secondary | ICD-10-CM | POA: Diagnosis not present

## 2020-04-29 DIAGNOSIS — N401 Enlarged prostate with lower urinary tract symptoms: Secondary | ICD-10-CM | POA: Diagnosis not present

## 2020-04-29 DIAGNOSIS — Z0000 Encounter for general adult medical examination without abnormal findings: Secondary | ICD-10-CM | POA: Diagnosis not present

## 2020-05-23 DIAGNOSIS — L820 Inflamed seborrheic keratosis: Secondary | ICD-10-CM | POA: Diagnosis not present

## 2020-05-23 DIAGNOSIS — L570 Actinic keratosis: Secondary | ICD-10-CM | POA: Diagnosis not present

## 2021-03-16 DIAGNOSIS — H26493 Other secondary cataract, bilateral: Secondary | ICD-10-CM | POA: Diagnosis not present

## 2021-05-02 DIAGNOSIS — I10 Essential (primary) hypertension: Secondary | ICD-10-CM | POA: Diagnosis not present

## 2021-05-02 DIAGNOSIS — Z0001 Encounter for general adult medical examination with abnormal findings: Secondary | ICD-10-CM | POA: Diagnosis not present

## 2021-05-02 DIAGNOSIS — D519 Vitamin B12 deficiency anemia, unspecified: Secondary | ICD-10-CM | POA: Diagnosis not present

## 2021-05-02 DIAGNOSIS — Z13220 Encounter for screening for lipoid disorders: Secondary | ICD-10-CM | POA: Diagnosis not present

## 2021-05-05 DIAGNOSIS — Z6822 Body mass index (BMI) 22.0-22.9, adult: Secondary | ICD-10-CM | POA: Diagnosis not present

## 2021-05-05 DIAGNOSIS — Z0000 Encounter for general adult medical examination without abnormal findings: Secondary | ICD-10-CM | POA: Diagnosis not present

## 2021-05-05 DIAGNOSIS — N401 Enlarged prostate with lower urinary tract symptoms: Secondary | ICD-10-CM | POA: Diagnosis not present

## 2021-05-05 DIAGNOSIS — C4491 Basal cell carcinoma of skin, unspecified: Secondary | ICD-10-CM | POA: Diagnosis not present

## 2021-05-05 DIAGNOSIS — Z23 Encounter for immunization: Secondary | ICD-10-CM | POA: Diagnosis not present

## 2021-05-15 DIAGNOSIS — H43393 Other vitreous opacities, bilateral: Secondary | ICD-10-CM | POA: Diagnosis not present

## 2021-05-15 DIAGNOSIS — H524 Presbyopia: Secondary | ICD-10-CM | POA: Diagnosis not present

## 2022-04-18 DIAGNOSIS — R5382 Chronic fatigue, unspecified: Secondary | ICD-10-CM | POA: Diagnosis not present

## 2022-04-18 DIAGNOSIS — Z13220 Encounter for screening for lipoid disorders: Secondary | ICD-10-CM | POA: Diagnosis not present

## 2022-04-18 DIAGNOSIS — D519 Vitamin B12 deficiency anemia, unspecified: Secondary | ICD-10-CM | POA: Diagnosis not present

## 2022-04-18 DIAGNOSIS — Z131 Encounter for screening for diabetes mellitus: Secondary | ICD-10-CM | POA: Diagnosis not present

## 2022-04-18 DIAGNOSIS — Z1329 Encounter for screening for other suspected endocrine disorder: Secondary | ICD-10-CM | POA: Diagnosis not present

## 2022-04-18 DIAGNOSIS — I10 Essential (primary) hypertension: Secondary | ICD-10-CM | POA: Diagnosis not present

## 2022-05-14 DIAGNOSIS — N401 Enlarged prostate with lower urinary tract symptoms: Secondary | ICD-10-CM | POA: Diagnosis not present

## 2022-05-14 DIAGNOSIS — Z6822 Body mass index (BMI) 22.0-22.9, adult: Secondary | ICD-10-CM | POA: Diagnosis not present

## 2022-05-14 DIAGNOSIS — Z0000 Encounter for general adult medical examination without abnormal findings: Secondary | ICD-10-CM | POA: Diagnosis not present

## 2022-05-14 DIAGNOSIS — J302 Other seasonal allergic rhinitis: Secondary | ICD-10-CM | POA: Diagnosis not present

## 2022-05-14 DIAGNOSIS — R0781 Pleurodynia: Secondary | ICD-10-CM | POA: Diagnosis not present

## 2022-05-14 DIAGNOSIS — C4491 Basal cell carcinoma of skin, unspecified: Secondary | ICD-10-CM | POA: Diagnosis not present

## 2022-09-20 DIAGNOSIS — D0421 Carcinoma in situ of skin of right ear and external auricular canal: Secondary | ICD-10-CM | POA: Diagnosis not present

## 2022-09-20 DIAGNOSIS — X32XXXA Exposure to sunlight, initial encounter: Secondary | ICD-10-CM | POA: Diagnosis not present

## 2022-09-20 DIAGNOSIS — L570 Actinic keratosis: Secondary | ICD-10-CM | POA: Diagnosis not present

## 2022-10-12 DIAGNOSIS — J0110 Acute frontal sinusitis, unspecified: Secondary | ICD-10-CM | POA: Diagnosis not present

## 2022-10-12 DIAGNOSIS — Z6823 Body mass index (BMI) 23.0-23.9, adult: Secondary | ICD-10-CM | POA: Diagnosis not present

## 2022-10-12 DIAGNOSIS — R030 Elevated blood-pressure reading, without diagnosis of hypertension: Secondary | ICD-10-CM | POA: Diagnosis not present

## 2023-05-16 DIAGNOSIS — Z131 Encounter for screening for diabetes mellitus: Secondary | ICD-10-CM | POA: Diagnosis not present

## 2023-05-16 DIAGNOSIS — Z0000 Encounter for general adult medical examination without abnormal findings: Secondary | ICD-10-CM | POA: Diagnosis not present

## 2023-05-16 DIAGNOSIS — R5382 Chronic fatigue, unspecified: Secondary | ICD-10-CM | POA: Diagnosis not present

## 2023-05-16 DIAGNOSIS — Z1321 Encounter for screening for nutritional disorder: Secondary | ICD-10-CM | POA: Diagnosis not present

## 2023-05-16 DIAGNOSIS — I10 Essential (primary) hypertension: Secondary | ICD-10-CM | POA: Diagnosis not present

## 2023-05-16 DIAGNOSIS — Z1329 Encounter for screening for other suspected endocrine disorder: Secondary | ICD-10-CM | POA: Diagnosis not present

## 2023-05-20 DIAGNOSIS — Z0000 Encounter for general adult medical examination without abnormal findings: Secondary | ICD-10-CM | POA: Diagnosis not present

## 2023-05-20 DIAGNOSIS — J302 Other seasonal allergic rhinitis: Secondary | ICD-10-CM | POA: Diagnosis not present

## 2023-05-20 DIAGNOSIS — N401 Enlarged prostate with lower urinary tract symptoms: Secondary | ICD-10-CM | POA: Diagnosis not present

## 2023-05-20 DIAGNOSIS — C4491 Basal cell carcinoma of skin, unspecified: Secondary | ICD-10-CM | POA: Diagnosis not present

## 2023-05-20 DIAGNOSIS — R0781 Pleurodynia: Secondary | ICD-10-CM | POA: Diagnosis not present

## 2023-05-20 DIAGNOSIS — Z6822 Body mass index (BMI) 22.0-22.9, adult: Secondary | ICD-10-CM | POA: Diagnosis not present

## 2023-05-30 DIAGNOSIS — H524 Presbyopia: Secondary | ICD-10-CM | POA: Diagnosis not present

## 2023-05-30 DIAGNOSIS — H43393 Other vitreous opacities, bilateral: Secondary | ICD-10-CM | POA: Diagnosis not present

## 2023-08-07 DIAGNOSIS — I10 Essential (primary) hypertension: Secondary | ICD-10-CM | POA: Diagnosis not present

## 2024-02-01 DIAGNOSIS — H9203 Otalgia, bilateral: Secondary | ICD-10-CM | POA: Diagnosis not present

## 2024-02-01 DIAGNOSIS — Z6821 Body mass index (BMI) 21.0-21.9, adult: Secondary | ICD-10-CM | POA: Diagnosis not present

## 2024-02-01 DIAGNOSIS — H6121 Impacted cerumen, right ear: Secondary | ICD-10-CM | POA: Diagnosis not present

## 2024-02-01 DIAGNOSIS — H6123 Impacted cerumen, bilateral: Secondary | ICD-10-CM | POA: Diagnosis not present

## 2024-02-01 DIAGNOSIS — H6122 Impacted cerumen, left ear: Secondary | ICD-10-CM | POA: Diagnosis not present

## 2024-03-19 DIAGNOSIS — L570 Actinic keratosis: Secondary | ICD-10-CM | POA: Diagnosis not present

## 2024-03-19 DIAGNOSIS — X32XXXD Exposure to sunlight, subsequent encounter: Secondary | ICD-10-CM | POA: Diagnosis not present

## 2024-05-18 DIAGNOSIS — Z1321 Encounter for screening for nutritional disorder: Secondary | ICD-10-CM | POA: Diagnosis not present

## 2024-05-18 DIAGNOSIS — R5382 Chronic fatigue, unspecified: Secondary | ICD-10-CM | POA: Diagnosis not present

## 2024-05-18 DIAGNOSIS — L570 Actinic keratosis: Secondary | ICD-10-CM | POA: Diagnosis not present

## 2024-05-18 DIAGNOSIS — D519 Vitamin B12 deficiency anemia, unspecified: Secondary | ICD-10-CM | POA: Diagnosis not present

## 2024-05-21 DIAGNOSIS — J302 Other seasonal allergic rhinitis: Secondary | ICD-10-CM | POA: Diagnosis not present

## 2024-05-21 DIAGNOSIS — Z0000 Encounter for general adult medical examination without abnormal findings: Secondary | ICD-10-CM | POA: Diagnosis not present

## 2024-05-21 DIAGNOSIS — Z6821 Body mass index (BMI) 21.0-21.9, adult: Secondary | ICD-10-CM | POA: Diagnosis not present

## 2024-05-21 DIAGNOSIS — N400 Enlarged prostate without lower urinary tract symptoms: Secondary | ICD-10-CM | POA: Diagnosis not present

## 2024-06-02 DIAGNOSIS — H43393 Other vitreous opacities, bilateral: Secondary | ICD-10-CM | POA: Diagnosis not present
